# Patient Record
Sex: Female | Born: 1965 | Race: White | Hispanic: No | Marital: Single
Health system: Southern US, Community
[De-identification: ages and names within clinical notes are randomized; demographics above are authoritative.]

---

## 2005-07-05 ENCOUNTER — Other Ambulatory Visit: Admission: RE | Admit: 2005-07-05 | Discharge: 2005-07-05 | Payer: Self-pay | Admitting: Family Medicine

## 2005-07-19 ENCOUNTER — Encounter: Admission: RE | Admit: 2005-07-19 | Discharge: 2005-07-19 | Payer: Self-pay | Admitting: Family Medicine

## 2006-07-08 ENCOUNTER — Other Ambulatory Visit: Admission: RE | Admit: 2006-07-08 | Discharge: 2006-07-08 | Payer: Self-pay | Admitting: Family Medicine

## 2007-02-20 ENCOUNTER — Encounter: Admission: RE | Admit: 2007-02-20 | Discharge: 2007-02-20 | Payer: Self-pay | Admitting: Family Medicine

## 2013-06-10 ENCOUNTER — Other Ambulatory Visit: Payer: Self-pay | Admitting: Internal Medicine

## 2013-06-10 DIAGNOSIS — R29890 Loss of height: Secondary | ICD-10-CM

## 2013-07-01 ENCOUNTER — Other Ambulatory Visit: Payer: Self-pay

## 2013-07-08 ENCOUNTER — Ambulatory Visit
Admission: RE | Admit: 2013-07-08 | Discharge: 2013-07-08 | Disposition: A | Discharge status: Home / Self Care | Payer: Commercial Indemnity | Source: Ambulatory Visit | Attending: Internal Medicine | Admitting: Internal Medicine

## 2013-07-08 DIAGNOSIS — R29890 Loss of height: Secondary | ICD-10-CM

## 2014-06-29 ENCOUNTER — Other Ambulatory Visit: Payer: Self-pay | Admitting: Orthopedic Surgery

## 2014-06-29 DIAGNOSIS — N838 Other noninflammatory disorders of ovary, fallopian tube and broad ligament: Secondary | ICD-10-CM

## 2014-07-01 ENCOUNTER — Ambulatory Visit
Admission: RE | Admit: 2014-07-01 | Discharge: 2014-07-01 | Disposition: A | Discharge status: Home / Self Care | Payer: Commercial Indemnity | Source: Ambulatory Visit | Attending: Orthopedic Surgery | Admitting: Orthopedic Surgery

## 2014-07-01 DIAGNOSIS — N838 Other noninflammatory disorders of ovary, fallopian tube and broad ligament: Secondary | ICD-10-CM

## 2015-08-21 IMAGING — US US PELVIS COMPLETE
1 series · 14 of 25 positions shown · non-contrast
Comparison: Lumbar MRI 06/27/2014

CLINICAL DATA: Mass seen on ovary on MRI

EXAM:
TRANSABDOMINAL ULTRASOUND OF PELVIS
TECHNIQUE: Transabdominal ultrasound examination of the pelvis was performed
including evaluation of the uterus, ovaries, adnexal regions, and
pelvic cul-de-sac.

[Series 1: us pelvis complete · 0.35mm/px · 14 of 67 slices shown]
[im 1/67]
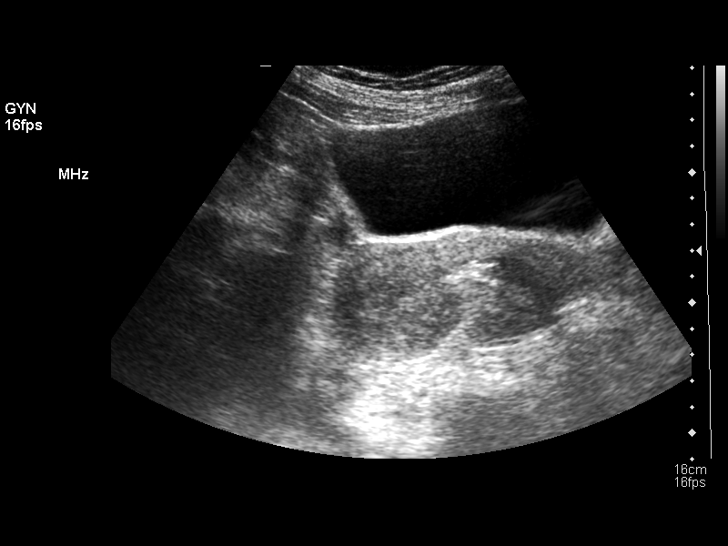
[im 6/67]
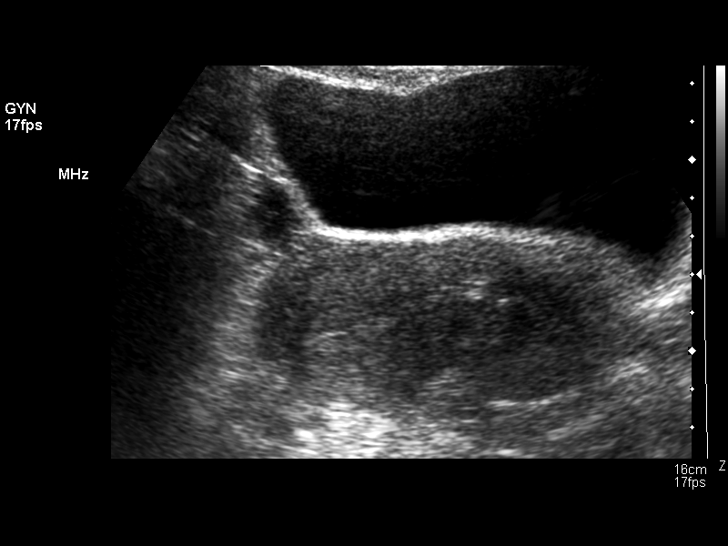
[im 12/67]
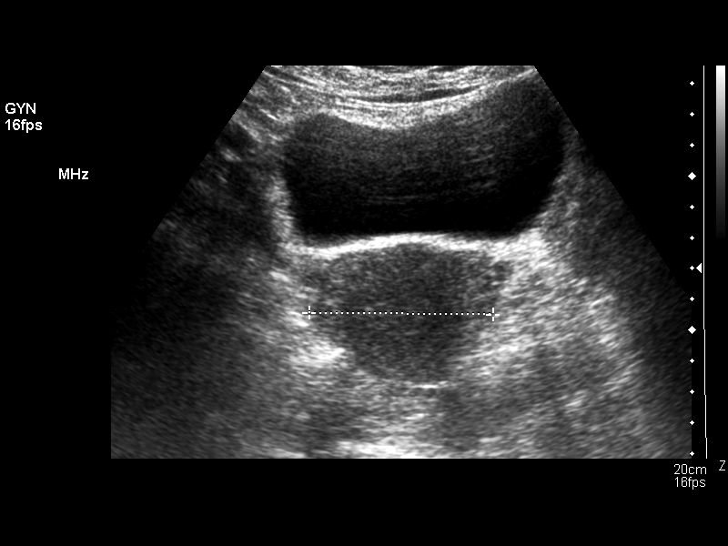
[im 17/67]
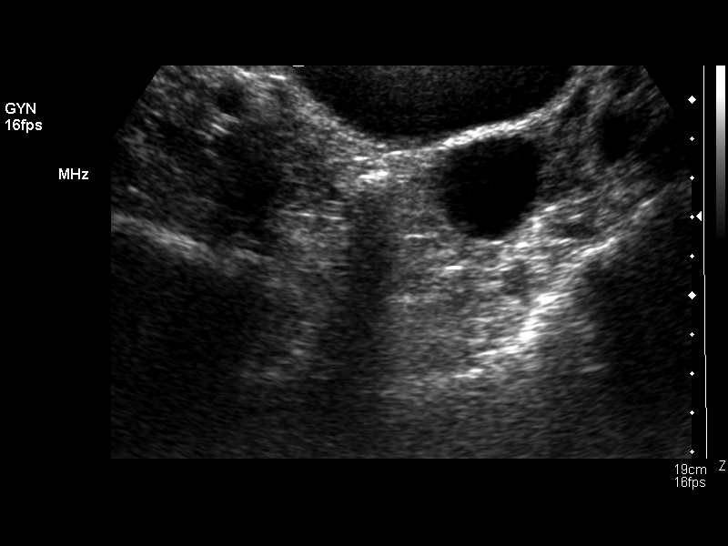
[im 23/67]
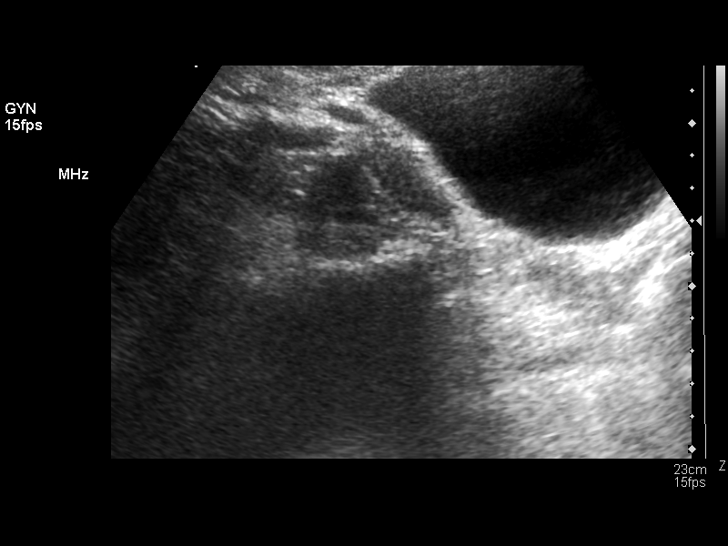
[im 25/67]
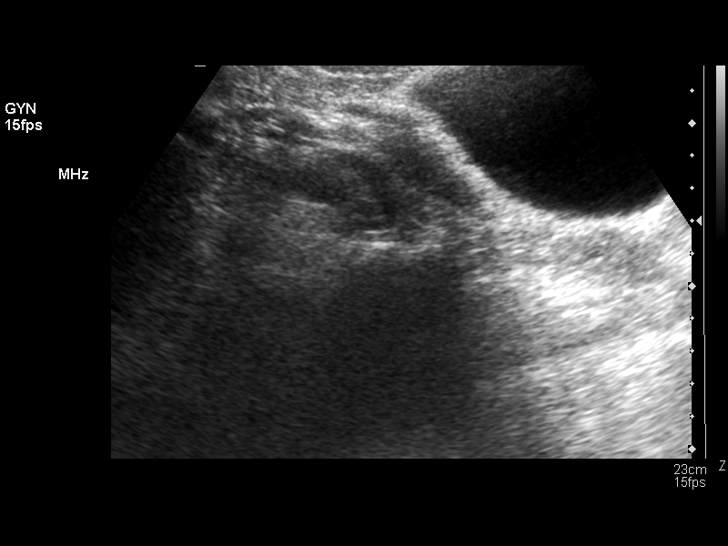
[im 31/67]
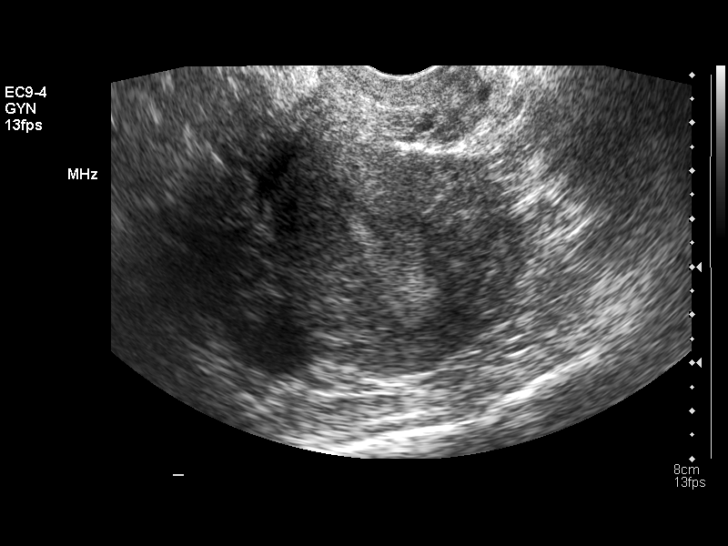
[im 36/67]
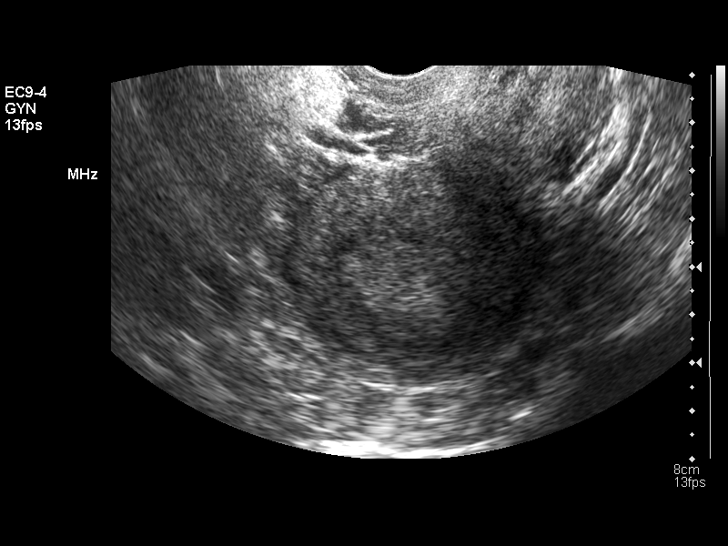
[im 42/67]
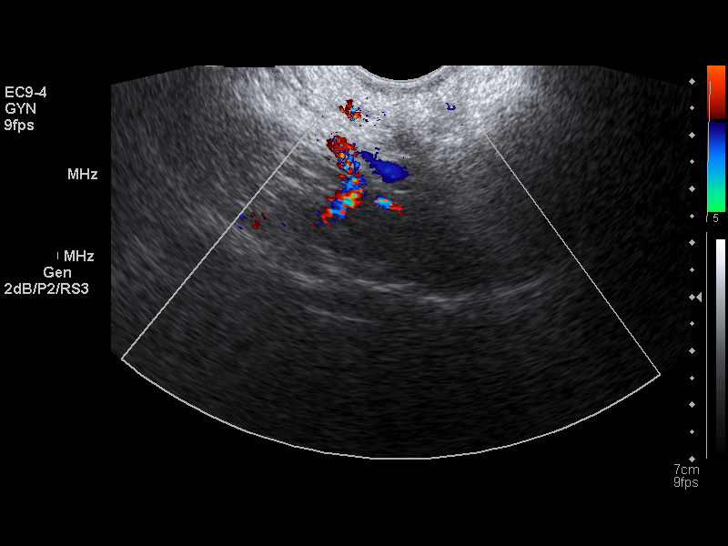
[im 45/67]
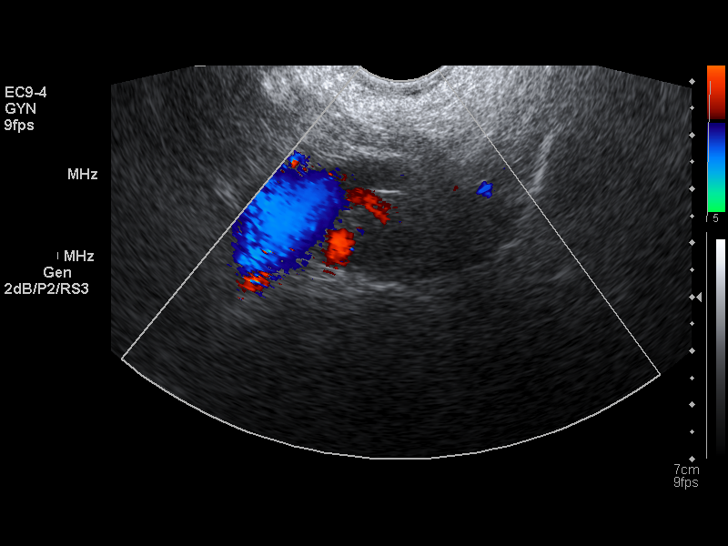
[im 50/67]
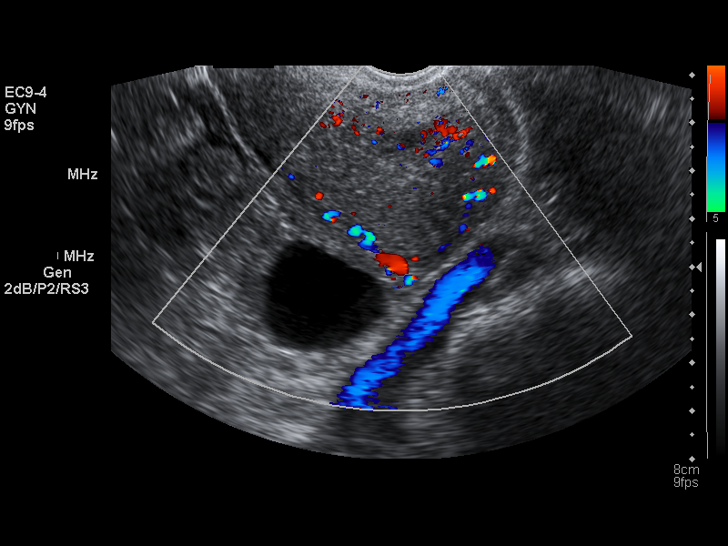
[im 56/67]
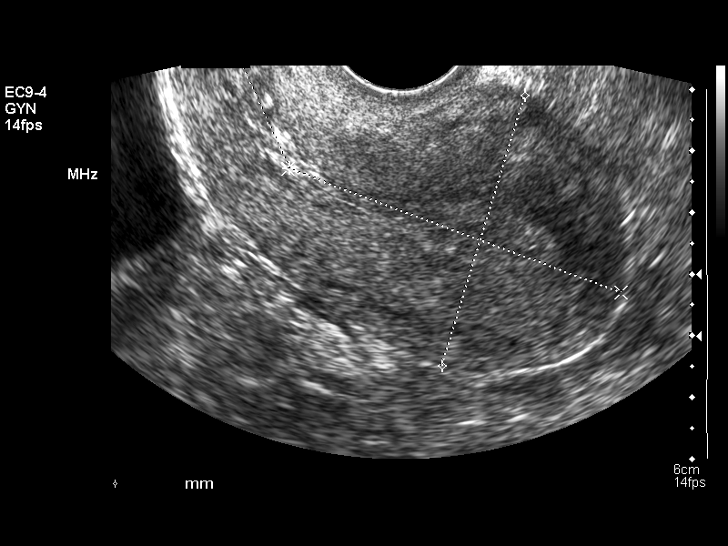
[im 61/67]
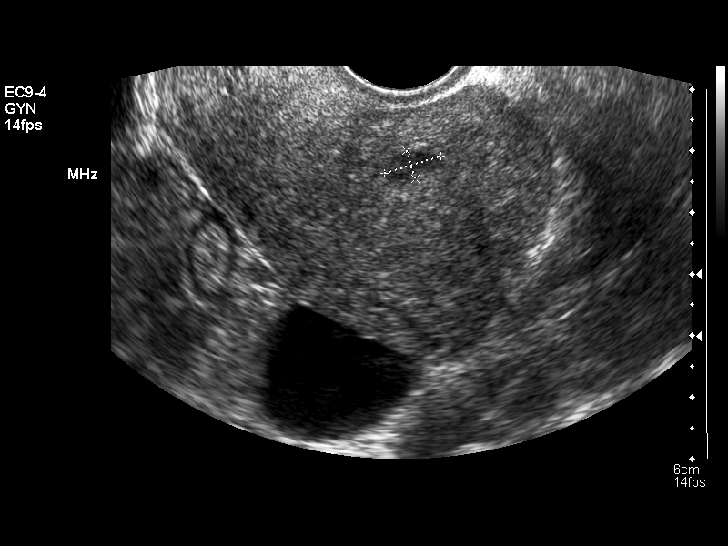
[im 67/67]
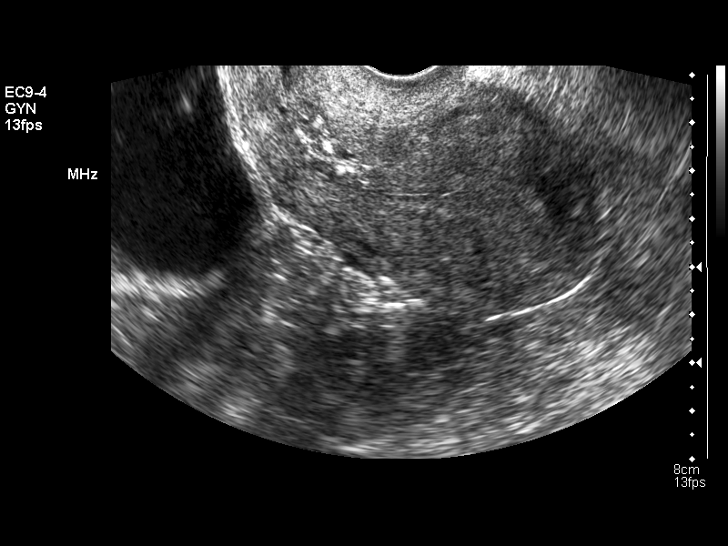

[14 of 25 positions shown; findings below may reference images not displayed]

FINDINGS: Uterus

Measurements: 7.8 x 4.6 x 5.5 cm. Uterus is retroverted. Small
hypoechoic area posteriorly measuring 10 mm likely represents a very
small fibroid.

Endometrium

Thickness: Normal thickness, 11 mm. No focal abnormality visualized.

Right ovary

Measurements: 3.1 x 1.8 x 2.1 cm. Normal appearance/no adnexal mass.

Left ovary

Measurements: 2.8 x 1.9 x 3.1 cm. 2.4 cm simple appearing cyst or
dominant follicle in the left ovary, corresponding to the cystic
structure seen on MRI.

Other findings:  No free fluid
IMPRESSION: 2.4 cm simple appearing cyst or follicle on the left ovary. No acute
findings or significant abnormality.

## 2016-07-23 ENCOUNTER — Other Ambulatory Visit: Payer: Self-pay | Admitting: Internal Medicine

## 2016-07-23 DIAGNOSIS — N951 Menopausal and female climacteric states: Secondary | ICD-10-CM

## 2017-06-10 ENCOUNTER — Other Ambulatory Visit: Payer: Self-pay | Admitting: Family Medicine

## 2017-06-10 DIAGNOSIS — R5381 Other malaise: Secondary | ICD-10-CM

## 2017-06-13 ENCOUNTER — Other Ambulatory Visit: Payer: Self-pay | Admitting: Family Medicine

## 2017-06-13 DIAGNOSIS — N951 Menopausal and female climacteric states: Secondary | ICD-10-CM

## 2017-06-20 ENCOUNTER — Ambulatory Visit
Admission: RE | Admit: 2017-06-20 | Discharge: 2017-06-20 | Disposition: A | Discharge status: Home / Self Care | Payer: BLUE CROSS/BLUE SHIELD | Source: Ambulatory Visit | Attending: Family Medicine | Admitting: Family Medicine

## 2017-06-20 DIAGNOSIS — N951 Menopausal and female climacteric states: Secondary | ICD-10-CM
